# Patient Record
Sex: Male | Born: 1975 | Hispanic: Yes | Smoking: Current some day smoker
Health system: Southern US, Community
[De-identification: ages and names within clinical notes are randomized; demographics above are authoritative.]

## PROBLEM LIST (undated history)

## (undated) DIAGNOSIS — R740 Nonspecific elevation of levels of transaminase and lactic acid dehydrogenase [LDH]: Principal | ICD-10-CM

## (undated) DIAGNOSIS — E669 Obesity, unspecified: Secondary | ICD-10-CM

## (undated) DIAGNOSIS — F17210 Nicotine dependence, cigarettes, uncomplicated: Secondary | ICD-10-CM

## (undated) DIAGNOSIS — S7292XA Unspecified fracture of left femur, initial encounter for closed fracture: Secondary | ICD-10-CM

## (undated) HISTORY — DX: Unspecified fracture of left femur, initial encounter for closed fracture: S72.92XA

## (undated) HISTORY — DX: Obesity, unspecified: E66.9

## (undated) HISTORY — PX: TONSILLECTOMY: SUR1361

## (undated) HISTORY — DX: Nonspecific elevation of levels of transaminase and lactic acid dehydrogenase (ldh): R74.0

## (undated) HISTORY — DX: Nicotine dependence, cigarettes, uncomplicated: F17.210

## (undated) HISTORY — PX: NASAL SINUS SURGERY: SHX719

---

## 2012-02-24 ENCOUNTER — Encounter (HOSPITAL_BASED_OUTPATIENT_CLINIC_OR_DEPARTMENT_OTHER): Payer: Self-pay | Admitting: *Deleted

## 2012-02-24 ENCOUNTER — Emergency Department (HOSPITAL_BASED_OUTPATIENT_CLINIC_OR_DEPARTMENT_OTHER): Payer: 59

## 2012-02-24 ENCOUNTER — Emergency Department (HOSPITAL_BASED_OUTPATIENT_CLINIC_OR_DEPARTMENT_OTHER)
Admission: EM | Admit: 2012-02-24 | Discharge: 2012-02-24 | Disposition: A | Discharge status: Home / Self Care | Payer: 59 | Attending: Emergency Medicine | Admitting: Emergency Medicine

## 2012-02-24 DIAGNOSIS — Z87891 Personal history of nicotine dependence: Secondary | ICD-10-CM | POA: Insufficient documentation

## 2012-02-24 DIAGNOSIS — R071 Chest pain on breathing: Secondary | ICD-10-CM | POA: Insufficient documentation

## 2012-02-24 DIAGNOSIS — R0789 Other chest pain: Secondary | ICD-10-CM

## 2012-02-24 MED ORDER — HYDROCODONE-ACETAMINOPHEN 5-325 MG PO TABS: ORAL_TABLET | ORAL | Status: DC
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
## 2012-02-24 NOTE — ED Notes (Addendum)
Patient transported to XR. 

## 2012-02-24 NOTE — Patient Instructions (Signed)
Pt instructed on the proper use of an IS  Pt demonstrated x 10  Pt tolerated well
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
## 2013-10-19 IMAGING — CR DG CHEST 2V
2 series · 2 of 2 positions shown · non-contrast
Comparison: None

CLINICAL DATA: Chest pain

CHEST - 2 VIEW

[w chest pa]
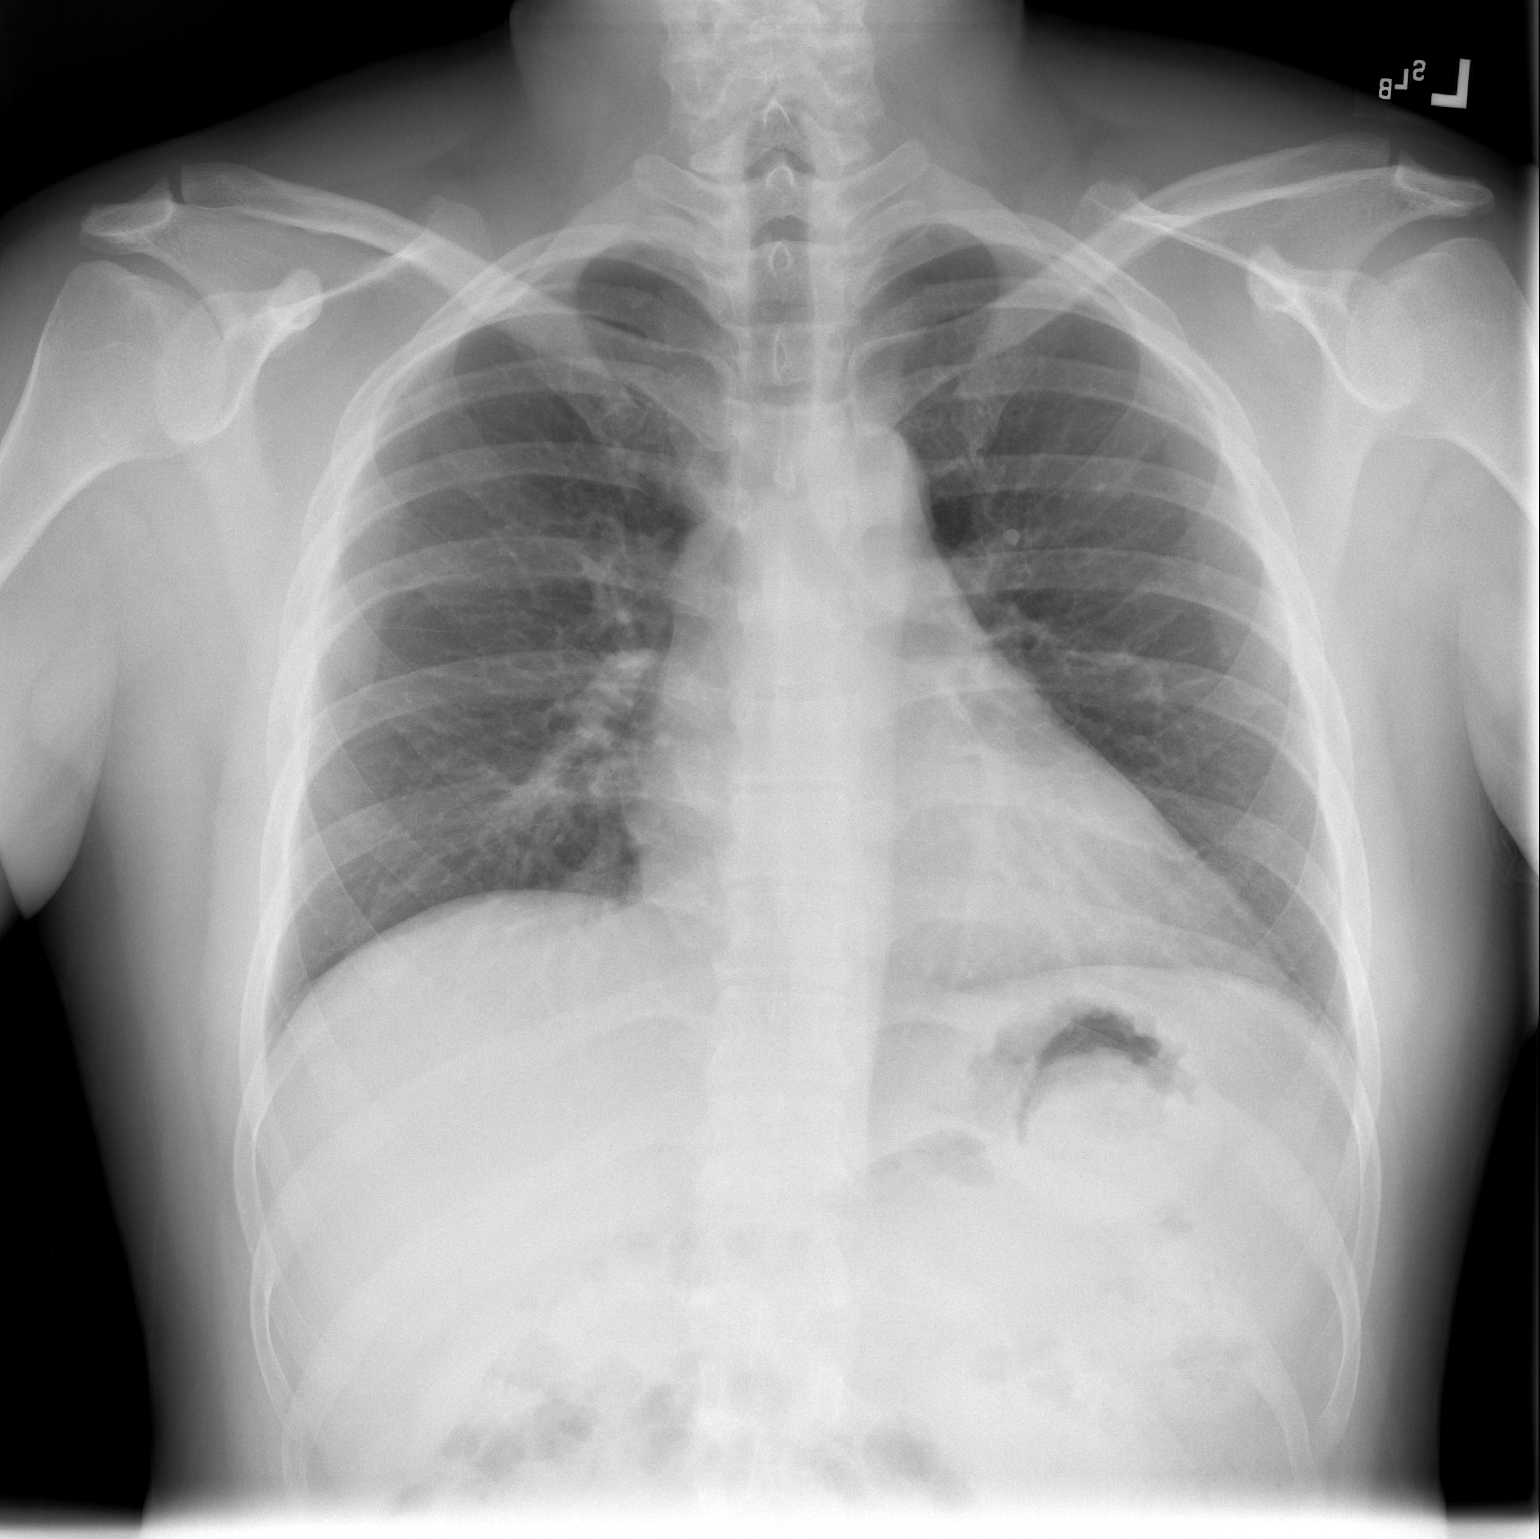

[w chest lat]
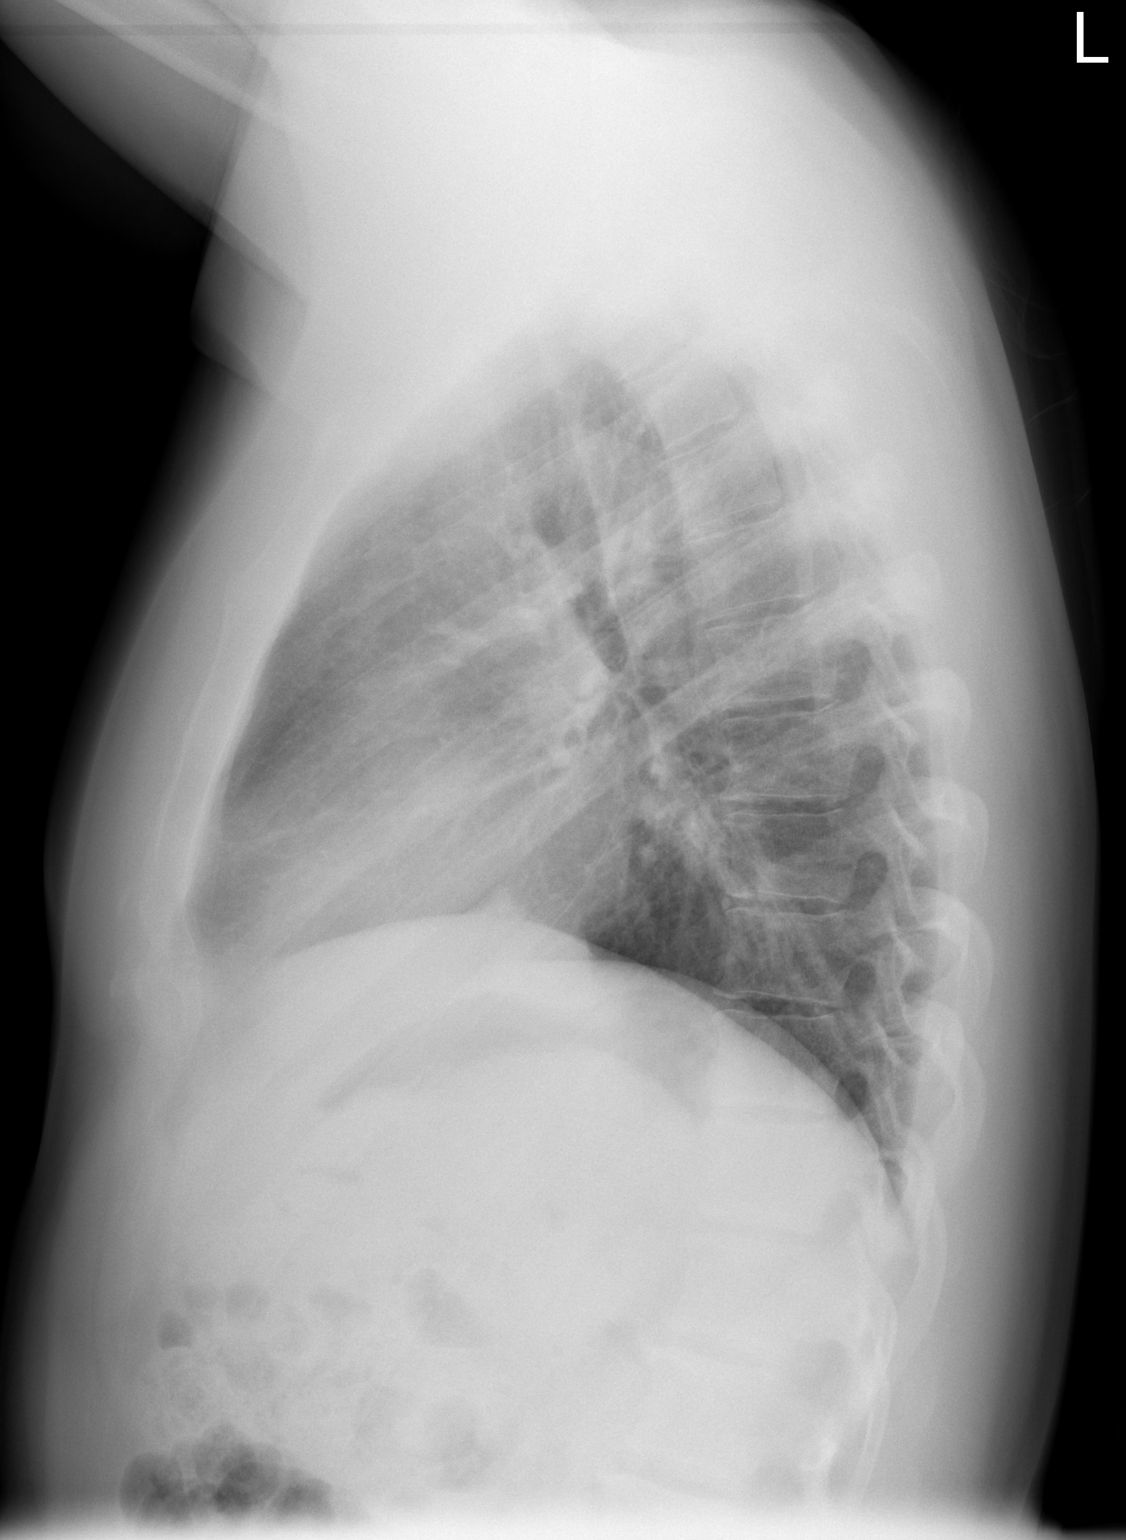

[2 of 2 positions shown; findings below may reference images not displayed]

FINDINGS: Upper-normal size of cardiac silhouette.
Mediastinal contours and pulmonary vascularity normal.
Lungs clear.
No pleural effusion or pneumothorax.
Osseous structures unremarkable.
IMPRESSION: No acute abnormalities.

## 2017-05-22 ENCOUNTER — Other Ambulatory Visit: Payer: Self-pay

## 2017-05-22 ENCOUNTER — Emergency Department
Admission: EM | Admit: 2017-05-22 | Discharge: 2017-05-22 | Disposition: A | Discharge status: Home / Self Care | Payer: 59 | Source: Home / Self Care | Attending: Family Medicine | Admitting: Family Medicine

## 2017-05-22 DIAGNOSIS — S29012A Strain of muscle and tendon of back wall of thorax, initial encounter: Secondary | ICD-10-CM

## 2017-05-22 DIAGNOSIS — J069 Acute upper respiratory infection, unspecified: Secondary | ICD-10-CM | POA: Diagnosis not present

## 2017-05-22 MED ORDER — BENZONATATE 100 MG PO CAPS: 100.0000 mg | ORAL_CAPSULE | Freq: Three times a day (TID) | ORAL | 0 refills | Status: DC

## 2017-05-22 MED ORDER — CYCLOBENZAPRINE HCL 5 MG PO TABS: 5.0000 mg | ORAL_TABLET | Freq: Two times a day (BID) | ORAL | 0 refills | Status: DC | PRN

## 2017-05-22 NOTE — ED Provider Notes (Signed)
[facility]    CSN: [id number] Arrival date & time: 05/22/17  1012     History   Chief Complaint Chief Complaint  Patient presents with  . Cough    HPI Xavier Ross is a 41 y.o. male.   HPI Xavier Ross is a 41 y.o. male presenting to UC with c/o 5-6 days of gradually worsening sinus and chest congestion with associated fatigue, loose stools headache, and subjective fever.  He is also c/o a back ache with a "cool" sensation in his upper and mid back.  Denies known injury but states he was coughing more last week.  He has been taking a leftover prescription of amoxicillin for the last 5 days. He does not recall what it was leftover from and states he still has pills left but is unsure how many deals the prescription is for.  His main concern is his back today.  Denies numbness or weakness in arms or legs.    History reviewed. No pertinent past medical history.  There are no active problems to display for this patient.   Past Surgical History:  Procedure Laterality Date  . NASAL SINUS SURGERY    . TONSILLECTOMY         Home Medications    Prior to Admission medications   Medication Sig Start Date End Date Taking? Authorizing Provider  benzonatate (TESSALON) 100 MG capsule Take 1-2 capsules (100-200 mg total) by mouth every 8 (eight) hours. 05/22/17   Lurene ShadowPhelps, Chasten Blaze O, PA-C  cyclobenzaprine (FLEXERIL) 5 MG tablet Take 1-2 tablets (5-10 mg total) by mouth 2 (two) times daily as needed for muscle spasms. 05/22/17   Lurene ShadowPhelps, Anthem Frazer O, PA-C  HYDROcodone-acetaminophen (NORCO/VICODIN) 5-325 MG per tablet Take 1-2 tabs by mouth every 6 hours when necessary pain. 02/24/12   Cyndra NumbersHunt, Meagan, MD    Family History History reviewed. No pertinent family history.  Social History Social History   Tobacco Use  . Smoking status: Former Games developermoker  . Smokeless tobacco: Never Used  Substance Use Topics  . Alcohol use: Yes    Comment: occasionally  . Drug use: No      Allergies   Patient has no known allergies.   Review of Systems Review of Systems  Constitutional: Positive for fever. Negative for chills.  HENT: Positive for congestion and rhinorrhea. Negative for ear pain, sore throat, trouble swallowing and voice change.   Respiratory: Positive for cough. Negative for shortness of breath.   Cardiovascular: Negative for chest pain and palpitations.  Gastrointestinal: Positive for diarrhea ( loose stool). Negative for abdominal pain, nausea and vomiting.  Musculoskeletal: Positive for back pain and myalgias. Negative for arthralgias, neck pain and neck stiffness.  Skin: Negative for rash.  Neurological: Positive for headaches. Negative for dizziness, weakness, light-headedness and numbness.     Physical Exam Triage Vital Signs ED Triage Vitals  Enc Vitals Group     BP 05/22/17 1048 116/88     Pulse Rate 05/22/17 1048 96     Resp --      Temp 05/22/17 1048 98.4 F (36.9 C)     Temp Source 05/22/17 1048 Oral     SpO2 05/22/17 1048 96 %     Weight 05/22/17 1048 224 lb (101.6 kg)     Height 05/22/17 1048 5\' 5"  (1.651 m)     Head Circumference --      Peak Flow --      Pain Score 05/22/17 1049 0     Pain Loc --  Pain Edu? --      Excl. in GC? --    No data found.  Updated Vital Signs BP 116/88 (BP Location: Right Arm)   Pulse 96   Temp 98.4 F (36.9 C) (Oral)   Ht 5\' 5"  (1.651 m)   Wt 224 lb (101.6 kg)   SpO2 96%   BMI 37.28 kg/m   Visual Acuity Right Eye Distance:   Left Eye Distance:   Bilateral Distance:    Right Eye Near:   Left Eye Near:    Bilateral Near:     Physical Exam  Constitutional: He is oriented to person, place, and time. He appears well-developed and well-nourished. No distress.  HENT:  Head: Normocephalic and atraumatic.  Right Ear: Tympanic membrane normal.  Left Ear: Tympanic membrane normal.  Nose: Nose normal.  Mouth/Throat: Uvula is midline, oropharynx is clear and moist and mucous  membranes are normal.  Eyes: EOM are normal.  Neck: Normal range of motion. Neck supple.  Cardiovascular: Normal rate and regular rhythm.  Pulmonary/Chest: Effort normal and breath sounds normal. No stridor. No respiratory distress. He has no wheezes. He has no rales.  Musculoskeletal: Normal range of motion. He exhibits tenderness. He exhibits no edema.  No midline spinal tenderness. Tenderness to upper and mid back muscles.  Full ROM upper and lower extremities bilaterally with 5/5 strength  Normal gait  Lymphadenopathy:    He has no cervical adenopathy.  Neurological: He is alert and oriented to person, place, and time.  Skin: Skin is warm and dry. No rash noted. He is not diaphoretic. No erythema.  Psychiatric: He has a normal mood and affect. His behavior is normal.  Nursing note and vitals reviewed.    UC Treatments / Results  Labs (all labs ordered are listed, but only abnormal results are displayed) Labs Reviewed - No data to display  EKG  EKG Interpretation None       Radiology No results found.  Procedures Procedures (including critical care time)  Medications Ordered in UC Medications - No data to display   Initial Impression / Assessment and Plan / UC Course  I have reviewed the triage vital signs and the nursing notes.  Pertinent labs & imaging results that were available during my care of the patient were reviewed by me and considered in my medical decision making (see chart for details).     Hx and exam c/w URI and back muscle strain.  Back sensation likely due to muscle strain from recent coughing May continue to complete the amoxicillin as pt's URI symptoms seem to be improving per pt reported hx.   May take up to 7-10 days depending on how many pills/doses he has left. Encouraged fluids and rest May try Flexeril, acetaminophen, ibuprofen, and warm and cool compresses for back  F/u with PCP in 1 week if not improving Resource guide provided.    Final Clinical Impressions(s) / UC Diagnoses   Final diagnoses:  Upper respiratory tract infection, unspecified type  Muscle strain of upper back    ED Discharge Orders        Ordered    cyclobenzaprine (FLEXERIL) 5 MG tablet  2 times daily PRN     05/22/17 1053    benzonatate (TESSALON) 100 MG capsule  Every 8 hours     05/22/17 1053       Controlled Substance Prescriptions [place] Controlled Substance Registry consulted? Not Applicable   Lurene Shadowhelps, Anias Bartol O, PA-C 05/22/17 1101

## 2017-05-22 NOTE — ED Triage Notes (Signed)
Symptoms started Wednesday with fatigue, headache, diarrhea, fever.  Felt some better yesterday, feels worse today.

## 2017-05-22 NOTE — Discharge Instructions (Signed)
°  You may take 500mg  acetaminophen every 4-6 hours or in combination with ibuprofen 400-600mg  every 6-8 hours as needed for pain, inflammation, and fever.  Be sure to drink at least eight 8oz glasses of water to stay well hydrated and get at least 8 hours of sleep at night, preferably more while sick.   You may continue to take your amoxicillin for a total of 7-10 days (depending on how that previous prescription was written and how many pills you have left)

## 2017-06-02 ENCOUNTER — Ambulatory Visit: Payer: 59 | Admitting: Physician Assistant

## 2017-06-02 ENCOUNTER — Encounter: Payer: Self-pay | Admitting: Physician Assistant

## 2017-06-02 VITALS — BP 111/78 | HR 68 | Temp 98.0°F | Ht 65.0 in | Wt 225.0 lb

## 2017-06-02 DIAGNOSIS — Z13220 Encounter for screening for lipoid disorders: Secondary | ICD-10-CM

## 2017-06-02 DIAGNOSIS — Z131 Encounter for screening for diabetes mellitus: Secondary | ICD-10-CM | POA: Diagnosis not present

## 2017-06-02 DIAGNOSIS — Z130 Encounter for screening for diseases of the blood and blood-forming organs and certain disorders involving the immune mechanism: Secondary | ICD-10-CM | POA: Diagnosis not present

## 2017-06-02 DIAGNOSIS — R21 Rash and other nonspecific skin eruption: Secondary | ICD-10-CM

## 2017-06-02 DIAGNOSIS — Z7689 Persons encountering health services in other specified circumstances: Secondary | ICD-10-CM

## 2017-06-02 MED ORDER — TRIAMCINOLONE ACETONIDE 0.5 % EX OINT: 1.0000 "application " | TOPICAL_OINTMENT | Freq: Two times a day (BID) | CUTANEOUS | 3 refills | Status: AC

## 2017-06-02 NOTE — Patient Instructions (Signed)
Fasting labs: The lab is a walk-in open M-F 7:30a-4:30p (closed 12:30-1:30p). Nothing to eat or drink after midnight or at least 8 hours before your blood draw. You can have water and your medications.   For rash: - apply steroid cream to affected areas of the leg twice a day for 2 weeks - keep skin moisturized
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
## 2017-06-07 ENCOUNTER — Encounter: Payer: Self-pay | Admitting: Physician Assistant

## 2017-06-07 DIAGNOSIS — L280 Lichen simplex chronicus: Secondary | ICD-10-CM | POA: Insufficient documentation

## 2017-06-12 LAB — LIPID PANEL W/REFLEX DIRECT LDL
Cholesterol: 125 mg/dL (ref <200)
HDL: 44 mg/dL (ref >40)
LDL CHOLESTEROL (CALC): 69 mg/dL
Non-HDL Cholesterol (Calc): 81 mg/dL (calc) (ref <130)
Total CHOL/HDL Ratio: 2.8 (calc) (ref <5.0)
Triglycerides: 42 mg/dL (ref <150)

## 2017-06-12 LAB — COMPREHENSIVE METABOLIC PANEL
AG Ratio: 1.7 (calc) (ref 1.0–2.5)
ALBUMIN MSPROF: 4.3 g/dL (ref 3.6–5.1)
ALKALINE PHOSPHATASE (APISO): 52 U/L (ref 40–115)
ALT: 51 U/L — ABNORMAL HIGH (ref 9–46)
AST: 33 U/L (ref 10–40)
BILIRUBIN TOTAL: 0.4 mg/dL (ref 0.2–1.2)
BUN: 11 mg/dL (ref 7–25)
CALCIUM: 9.6 mg/dL (ref 8.6–10.3)
CO2: 29 mmol/L (ref 20–32)
Chloride: 106 mmol/L (ref 98–110)
Creat: 0.85 mg/dL (ref 0.60–1.35)
Globulin: 2.5 g/dL (calc) (ref 1.9–3.7)
Glucose, Bld: 88 mg/dL (ref 65–99)
Potassium: 4.2 mmol/L (ref 3.5–5.3)
SODIUM: 142 mmol/L (ref 135–146)
TOTAL PROTEIN: 6.8 g/dL (ref 6.1–8.1)

## 2017-06-12 LAB — CBC
HEMATOCRIT: 44.1 % (ref 38.5–50.0)
HEMOGLOBIN: 14.5 g/dL (ref 13.2–17.1)
MCH: 28 pg (ref 27.0–33.0)
MCHC: 32.9 g/dL (ref 32.0–36.0)
MCV: 85.1 fL (ref 80.0–100.0)
MPV: 12 fL (ref 7.5–12.5)
Platelets: 321 10*3/uL (ref 140–400)
RBC: 5.18 10*6/uL (ref 4.20–5.80)
RDW: 12.5 % (ref 11.0–15.0)
WBC: 7.7 10*3/uL (ref 3.8–10.8)

## 2017-06-14 ENCOUNTER — Ambulatory Visit: Payer: 59 | Admitting: Physician Assistant

## 2017-06-14 VITALS — BP 120/80 | HR 72 | Wt 227.0 lb

## 2017-06-14 DIAGNOSIS — F17210 Nicotine dependence, cigarettes, uncomplicated: Secondary | ICD-10-CM

## 2017-06-14 DIAGNOSIS — Z6837 Body mass index (BMI) 37.0-37.9, adult: Secondary | ICD-10-CM | POA: Diagnosis not present

## 2017-06-14 DIAGNOSIS — R7401 Elevation of levels of liver transaminase levels: Secondary | ICD-10-CM

## 2017-06-14 DIAGNOSIS — L280 Lichen simplex chronicus: Secondary | ICD-10-CM

## 2017-06-14 DIAGNOSIS — R740 Nonspecific elevation of levels of transaminase and lactic acid dehydrogenase [LDH]: Secondary | ICD-10-CM

## 2017-06-14 DIAGNOSIS — E66812 Obesity, class 2: Secondary | ICD-10-CM | POA: Insufficient documentation

## 2017-06-14 HISTORY — DX: Elevation of levels of liver transaminase levels: R74.01

## 2017-06-14 NOTE — Patient Instructions (Addendum)
Repeat labs in 8 weeks Low-fat diet Avoid alcohol Increase aerobic exercise Decrease caloric intake to 1700-1750 calories per day to lose 1 pound per week  Nonalcoholic Fatty Liver Disease Diet Nonalcoholic fatty liver disease is a condition that causes fat to accumulate in and around the liver. The disease makes it harder for the liver to work the way that it should. Following a healthy diet can help to keep nonalcoholic fatty liver disease under control. It can also help to prevent or improve conditions that are associated with the disease, such as heart disease, diabetes, high blood pressure, and abnormal cholesterol levels. Along with regular exercise, this diet:  Promotes weight loss.  Helps to control blood sugar levels.  Helps to improve the way that the body uses insulin.  What do I need to know about this diet?  Use the glycemic index (GI) to plan your meals. The index tells you how quickly a food will raise your blood sugar. Choose low-GI foods. These foods take a longer time to raise blood sugar.  Keep track of how many calories you take in. Eating the right amount of calories will help you to achieve a healthy weight.  You may want to follow a Mediterranean diet. This diet includes a lot of vegetables, lean meats or fish, whole grains, fruits, and healthy oils and fats. What foods can I eat? Grains Whole grains, such as whole-wheat or whole-grain breads, crackers, tortillas, cereals, and pasta. Stone-ground whole wheat. Pumpernickel bread. Unsweetened oatmeal. Bulgur. Barley. Quinoa. Brown or wild rice. Corn or whole-wheat flour tortillas. Vegetables Lettuce. Spinach. Peas. Beets. Cauliflower. Cabbage. Broccoli. Carrots. Tomatoes. Squash. Eggplant. Herbs. Peppers. Onions. Cucumbers. Brussels sprouts. Yams and sweet potatoes. Beans. Lentils. Fruits Bananas. Apples. Oranges. Grapes. Papaya. Mango. Pomegranate. Kiwi. Grapefruit. Cherries. Meats and Other Protein Sources Seafood  and shellfish. Lean meats. Poultry. Tofu. Dairy Low-fat or fat-free dairy products, such as yogurt, cottage cheese, and cheese. Beverages Water. Sugar-free drinks. Tea. Coffee. Low-fat or skim milk. Milk alternatives, such as soy or almond milk. Real fruit juice. Condiments Mustard. Relish. Low-fat, low-sugar ketchup and barbecue sauce. Low-fat or fat-free mayonnaise. Sweets and Desserts Sugar-free sweets. Fats and Oils Avocado. Canola or olive oil. Nuts and nut butters. Seeds. The items listed above may not be a complete list of recommended foods or beverages. Contact your dietitian for more options. What foods are not recommended? Palm oil and coconut oil. Processed foods. Fried foods. Sweetened drinks, such as sweet tea, milkshakes, snow cones, iced sweet drinks, and sodas. Alcohol. Sweets. Foods that contain a lot of salt or sodium. The items listed above may not be a complete list of foods and beverages to avoid. Contact your dietitian for more information. This information is not intended to replace advice given to you by your health care provider. Make sure you discuss any questions you have with your health care provider. Document Released: 10/28/2014 Document Revised: 11/19/2015 Document Reviewed: 07/08/2014 Elsevier Interactive Patient Education  [employer]2018 [employer]
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
## 2017-06-25 ENCOUNTER — Encounter: Payer: Self-pay | Admitting: Physician Assistant

## 2017-06-25 DIAGNOSIS — F17210 Nicotine dependence, cigarettes, uncomplicated: Secondary | ICD-10-CM

## 2017-06-25 HISTORY — DX: Nicotine dependence, cigarettes, uncomplicated: F17.210

## 2018-09-06 ENCOUNTER — Emergency Department
Admission: EM | Admit: 2018-09-06 | Discharge: 2018-09-06 | Disposition: A | Discharge status: Home / Self Care | Payer: 59 | Source: Home / Self Care

## 2018-09-06 ENCOUNTER — Other Ambulatory Visit: Payer: Self-pay

## 2018-09-06 DIAGNOSIS — J069 Acute upper respiratory infection, unspecified: Secondary | ICD-10-CM | POA: Diagnosis not present

## 2018-09-06 DIAGNOSIS — B9789 Other viral agents as the cause of diseases classified elsewhere: Secondary | ICD-10-CM

## 2018-09-06 NOTE — Discharge Instructions (Signed)
  You may take 500mg acetaminophen every 4-6 hours or in combination with ibuprofen 400-600mg every 6-8 hours as needed for pain, inflammation, and fever.  Be sure to well hydrated with clear liquids and get at least 8 hours of sleep at night, preferably more while sick.   Please follow up with family medicine in 1 week if needed.   

## 2018-09-06 NOTE — ED Provider Notes (Signed)
[name]    CSN: [id number] Arrival date & time: 09/06/18  1514     History   Chief Complaint Chief Complaint  Patient presents with  . Cough  . Fatigue    HPI Xavier Ross is a 43 y.o. male.   HPI  Xavier Ross is a 43 y.o. male presenting to UC with c/o gradually improving cough and congestion that started 4 days ago. Cough is dry.  Low-grade fever at onset of symptoms. Denies chest pain or SOB. No medication taken today. Denies n/v/d. His wife and 4yo daughter also in [facility] to be seen for similar symptoms that started yesterday. No recent travel.    Past Medical History:  Diagnosis Date  . Elevated ALT measurement 06/14/2017  . Femur fracture, left (HCC)    age 19  . Light tobacco smoker <10 cigarettes per day 06/25/2017  . Obesity     Patient Active Problem List   Diagnosis Date Noted  . Light tobacco smoker <10 cigarettes per day 06/25/2017  . Elevated ALT measurement 06/14/2017  . Class 2 severe obesity due to excess calories with serious comorbidity and body mass index (BMI) of 37.0 to 37.9 in adult (HCC) 06/14/2017  . Lichen simplex chronicus 06/07/2017    Past Surgical History:  Procedure Laterality Date  . NASAL SINUS SURGERY    . TONSILLECTOMY         Home Medications    Prior to Admission medications   Medication Sig Start Date End Date Taking? Authorizing Provider  triamcinolone ointment (KENALOG) 0.5 % Apply 1 application topically 2 (two) times daily. To affected area, avoid eyes and face 06/02/17   Carlis Stable, PA-C    Family History Family History  Problem Relation Age of Onset  . Hypertension Father   . Cancer Neg Hx   . Heart attack Neg Hx   . Stroke Neg Hx     Social History Social History   Tobacco Use  . Smoking status: Current Some Day Smoker    Years: 26.00    Types: Cigarettes, Cigars    Last attempt to quit: 05/18/2017    Years since quitting: 1.3  . Smokeless tobacco: Never Used  .  Tobacco comment: 1 pack per week  Substance Use Topics  . Alcohol use: Yes    Comment: occasionally  . Drug use: No     Allergies   Patient has no known allergies.   Review of Systems Review of Systems  Constitutional: Negative for chills and fever.  HENT: Positive for congestion ( minimal). Negative for ear pain, sore throat, trouble swallowing and voice change.   Respiratory: Positive for cough. Negative for shortness of breath.   Cardiovascular: Negative for chest pain and palpitations.  Gastrointestinal: Negative for abdominal pain, diarrhea, nausea and vomiting.  Musculoskeletal: Negative for arthralgias, back pain and myalgias.  Skin: Negative for rash.     Physical Exam Triage Vital Signs ED Triage Vitals  Enc Vitals Group     BP 09/06/18 1625 123/88     Pulse Rate 09/06/18 1625 92     Resp 09/06/18 1625 18     Temp 09/06/18 1625 98.2 F (36.8 C)     Temp Source 09/06/18 1625 Oral     SpO2 09/06/18 1625 96 %     Weight 09/06/18 1626 242 lb (109.8 kg)     Height 09/06/18 1626 5\' 5"  (1.651 m)     Head Circumference --      Peak  Flow --      Pain Score 09/06/18 1626 0     Pain Loc --      Pain Edu? --      Excl. in GC? --    No data found.  Updated Vital Signs BP 123/88 (BP Location: Right Arm)   Pulse 92   Temp 98.2 F (36.8 C) (Oral)   Resp 18   Ht 5\' 5"  (1.651 m)   Wt 242 lb (109.8 kg)   SpO2 96%   BMI 40.27 kg/m   Visual Acuity Right Eye Distance:   Left Eye Distance:   Bilateral Distance:    Right Eye Near:   Left Eye Near:    Bilateral Near:     Physical Exam Vitals signs and nursing note reviewed.  Constitutional:      Appearance: Normal appearance. He is well-developed.  HENT:     Head: Normocephalic and atraumatic.     Right Ear: Tympanic membrane normal.     Left Ear: Tympanic membrane normal.     Nose: Nose normal.     Right Sinus: No maxillary sinus tenderness or frontal sinus tenderness.     Left Sinus: No maxillary sinus  tenderness or frontal sinus tenderness.     Mouth/Throat:     Lips: Pink.     Mouth: Mucous membranes are moist.     Pharynx: Oropharynx is clear. Uvula midline.  Neck:     Musculoskeletal: Normal range of motion.  Cardiovascular:     Rate and Rhythm: Normal rate and regular rhythm.  Pulmonary:     Effort: Pulmonary effort is normal. No respiratory distress.     Breath sounds: Normal breath sounds. No stridor. No wheezing or rhonchi.  Musculoskeletal: Normal range of motion.  Skin:    General: Skin is warm and dry.  Neurological:     Mental Status: He is alert and oriented to person, place, and time.  Psychiatric:        Behavior: Behavior normal.      UC Treatments / Results  Labs (all labs ordered are listed, but only abnormal results are displayed) Labs Reviewed - No data to display  EKG None  Radiology No results found.  Procedures Procedures (including critical care time)  Medications Ordered in UC Medications - No data to display  Initial Impression / Assessment and Plan / UC Course  I have reviewed the triage vital signs and the nursing notes.  Pertinent labs & imaging results that were available during my care of the patient were reviewed by me and considered in my medical decision making (see chart for details).     Hx and exam c/w viral illness Encouraged symptomatic tx Reassuring symptoms already improving.  Final Clinical Impressions(s) / UC Diagnoses   Final diagnoses:  Viral URI with cough     Discharge Instructions       You may take 500mg  acetaminophen every 4-6 hours or in combination with ibuprofen 400-600mg  every 6-8 hours as needed for pain, inflammation, and fever.  Be sure to well hydrated with clear liquids and get at least 8 hours of sleep at night, preferably more while sick.   Please follow up with family medicine in 1 week if needed.     ED Prescriptions    None     Controlled Substance Prescriptions [place] Controlled  Substance Registry consulted? Not Applicable   Rolla Plate 09/06/18 2831

## 2018-09-06 NOTE — ED Triage Notes (Signed)
Cough since Monday night, no congestion.  Felt like he had a fever Monday night.
